# Patient Record
Sex: Male | Born: 1940 | Race: White | Hispanic: No | Marital: Married | Smoking: Never smoker
Health system: Southern US, Community
[De-identification: ages and names within clinical notes are randomized; demographics above are authoritative.]

## PROBLEM LIST (undated history)

## (undated) DIAGNOSIS — N289 Disorder of kidney and ureter, unspecified: Secondary | ICD-10-CM

## (undated) DIAGNOSIS — I10 Essential (primary) hypertension: Secondary | ICD-10-CM

---

## 2011-12-22 ENCOUNTER — Encounter (HOSPITAL_BASED_OUTPATIENT_CLINIC_OR_DEPARTMENT_OTHER): Payer: Self-pay | Admitting: *Deleted

## 2011-12-22 ENCOUNTER — Emergency Department (HOSPITAL_BASED_OUTPATIENT_CLINIC_OR_DEPARTMENT_OTHER)
Admission: EM | Admit: 2011-12-22 | Discharge: 2011-12-22 | Disposition: A | Discharge status: Home / Self Care | Payer: Medicare Other | Attending: Emergency Medicine | Admitting: Emergency Medicine

## 2011-12-22 ENCOUNTER — Emergency Department (HOSPITAL_BASED_OUTPATIENT_CLINIC_OR_DEPARTMENT_OTHER): Payer: Medicare Other

## 2011-12-22 DIAGNOSIS — Z79899 Other long term (current) drug therapy: Secondary | ICD-10-CM | POA: Insufficient documentation

## 2011-12-22 DIAGNOSIS — N134 Hydroureter: Secondary | ICD-10-CM | POA: Insufficient documentation

## 2011-12-22 DIAGNOSIS — R109 Unspecified abdominal pain: Secondary | ICD-10-CM | POA: Insufficient documentation

## 2011-12-22 DIAGNOSIS — I10 Essential (primary) hypertension: Secondary | ICD-10-CM | POA: Insufficient documentation

## 2011-12-22 DIAGNOSIS — N200 Calculus of kidney: Secondary | ICD-10-CM | POA: Insufficient documentation

## 2011-12-22 HISTORY — DX: Disorder of kidney and ureter, unspecified: N28.9

## 2011-12-22 HISTORY — DX: Essential (primary) hypertension: I10

## 2011-12-22 LAB — URINALYSIS, ROUTINE W REFLEX MICROSCOPIC
Bilirubin Urine: NEGATIVE
Glucose, UA: 100 mg/dL — AB
Ketones, ur: 40 mg/dL — AB
Protein, ur: NEGATIVE mg/dL
Urobilinogen, UA: 0.2 mg/dL (ref 0.0–1.0)

## 2011-12-22 LAB — BASIC METABOLIC PANEL
BUN: 16 mg/dL (ref 6–23)
CO2: 24 mEq/L (ref 19–32)
Calcium: 9.3 mg/dL (ref 8.4–10.5)
GFR calc non Af Amer: 73 mL/min — ABNORMAL LOW (ref >90)
Glucose, Bld: 142 mg/dL — ABNORMAL HIGH (ref 70–99)
Sodium: 135 mEq/L (ref 135–145)

## 2011-12-22 LAB — URINE MICROSCOPIC-ADD ON

## 2011-12-22 MED ORDER — SODIUM CHLORIDE 0.9 % IV SOLN
Freq: Once | INTRAVENOUS | Status: AC
  Administered 2011-12-22: 1000 mL via INTRAVENOUS

## 2011-12-22 MED ORDER — FENTANYL CITRATE 0.05 MG/ML IJ SOLN
100.0000 ug | Freq: Once | INTRAMUSCULAR | Status: AC
  Administered 2011-12-22: 100 ug via INTRAVENOUS
  Filled 2011-12-22: qty 2

## 2011-12-22 MED ORDER — TAMSULOSIN HCL 0.4 MG PO CAPS: 0.4000 mg | ORAL_CAPSULE | Freq: Every day | ORAL | Status: AC

## 2011-12-22 MED ORDER — KETOROLAC TROMETHAMINE 30 MG/ML IJ SOLN
30.0000 mg | Freq: Once | INTRAMUSCULAR | Status: AC
  Administered 2011-12-22: 30 mg via INTRAVENOUS
  Filled 2011-12-22: qty 1

## 2011-12-22 MED ORDER — ONDANSETRON HCL 4 MG/2ML IJ SOLN
4.0000 mg | Freq: Once | INTRAMUSCULAR | Status: AC
Start: 2011-12-22 — End: 2011-12-22
  Administered 2011-12-22: 4 mg via INTRAVENOUS
  Filled 2011-12-22: qty 2

## 2011-12-22 MED ORDER — OXYCODONE-ACETAMINOPHEN 10-325 MG PO TABS: 1.0000 | ORAL_TABLET | ORAL | Status: AC | PRN

## 2011-12-22 MED ORDER — ONDANSETRON 8 MG PO TBDP: 8.0000 mg | ORAL_TABLET | Freq: Three times a day (TID) | ORAL | Status: AC | PRN

## 2011-12-22 NOTE — ED Provider Notes (Signed)
History   This chart was scribed for Nelia Shi, MD by Shari Heritage. The patient was seen in room MH09/MH09. Patient's care was started at 1919.     CSN: [id number]  Arrival date & time 12/22/11  1919   First MD Initiated Contact with Patient 12/22/11 1932      Chief Complaint  Patient presents with  . Flank Pain    (Consider location/radiation/quality/duration/timing/severity/associated sxs/prior treatment) Patient is a 71 y.o. male presenting with flank pain. The history is provided by the patient. No language interpreter was used.  Flank Pain This is a new problem. The current episode started 6 to 12 hours ago. The problem occurs constantly. The problem has not changed since onset.Associated symptoms include abdominal pain. Pertinent negatives include no chest pain, no headaches and no shortness of breath. Nothing relieves the symptoms.   Edward Bell is a 70 y.o. male who presents to the Emergency Department complaining of constant, moderate to severe, right flank pain onset 7.5 hours ago. Associated symptoms include pallor, nausea and vomiting. Patient has a history of kidney stones and states that current pain is similar to that. Patient did not mention any relieving factors. Patient also has medical h/o renal disorder and HTN. Patient has never smoked. Patient was seen at 7:33PM.  Past Medical History  Diagnosis Date  . Renal disorder   . Hypertension     History reviewed. No pertinent past surgical history.  No family history on file.  History  Substance Use Topics  . Smoking status: Never Smoker   . Smokeless tobacco: Not on file  . Alcohol Use: No      Review of Systems  HENT: Negative for ear pain.   Respiratory: Negative for shortness of breath.   Cardiovascular: Negative for chest pain.  Gastrointestinal: Positive for nausea, vomiting and abdominal pain.  Genitourinary: Positive for flank pain.  Skin: Positive for pallor.  Neurological: Negative for  headaches.  All other systems reviewed and are negative.    Allergies  Review of patient's allergies indicates no known allergies.  Home Medications   Current Outpatient Rx  Name Route Sig Dispense Refill  . VITAMIN D PO Oral Take 1 tablet by mouth daily.    Marland Kitchen FINASTERIDE 1 MG PO TABS Oral Take 1 mg by mouth daily.    Marland Kitchen LORAZEPAM 0.5 MG PO TABS Oral Take 0.5 mg by mouth 2 (two) times daily as needed. For anxiety    . LOVASTATIN 40 MG PO TABS Oral Take 80 mg by mouth at bedtime.    . ADULT MULTIVITAMIN W/MINERALS CH Oral Take 1 tablet by mouth daily.    . OXYCODONE-ACETAMINOPHEN 5-325 MG PO TABS Oral Take 1 tablet by mouth every 4 (four) hours as needed. For pain    . ONDANSETRON 8 MG PO TBDP Oral Take 1 tablet (8 mg total) by mouth every 8 (eight) hours as needed for nausea. 20 tablet 0  . OXYCODONE-ACETAMINOPHEN 10-325 MG PO TABS Oral Take 1 tablet by mouth every 4 (four) hours as needed for pain. 30 tablet 0  . TAMSULOSIN HCL 0.4 MG PO CAPS Oral Take 1 capsule (0.4 mg total) by mouth daily. 15 capsule 0    BP 155/84  Pulse 64  Resp 20  SpO2 100%  Physical Exam  Nursing note and vitals reviewed. Constitutional: He is oriented to person, place, and time. He appears well-developed. No distress.  HENT:  Head: Normocephalic and atraumatic.  Eyes: Pupils are equal, round, and reactive  to light.  Neck: Normal range of motion. Thyromegaly: .meds.  Cardiovascular: Normal rate and intact distal pulses.   Pulmonary/Chest: No respiratory distress.  Abdominal: Normal appearance. He exhibits no distension. There is no tenderness. There is no rebound.  Musculoskeletal: Normal range of motion.  Neurological: He is alert and oriented to person, place, and time. No cranial nerve deficit.  Skin: Skin is warm and dry. No rash noted.  Psychiatric: He has a normal mood and affect. His behavior is normal.    ED Course  Procedures (including critical care time)  Scheduled Meds:    .  sodium chloride   Intravenous Once  . sodium chloride   Intravenous Once  . fentaNYL  100 mcg Intravenous Once  . fentaNYL  100 mcg Intravenous Once  . ketorolac  30 mg Intravenous Once  . ondansetron  4 mg Intravenous Once   Continuous Infusions:  PRN Meds:.  DIAGNOSTIC STUDIES: Oxygen Saturation is 100% on room air, normal by my interpretation.  Results for orders placed during the hospital encounter of 12/22/11  URINALYSIS, ROUTINE W REFLEX MICROSCOPIC      Component Value Range   Color, Urine YELLOW  YELLOW   APPearance CLEAR  CLEAR   Specific Gravity, Urine 1.018  1.005 - 1.030   pH 6.0  5.0 - 8.0   Glucose, UA 100 (*) NEGATIVE mg/dL   Hgb urine dipstick LARGE (*) NEGATIVE   Bilirubin Urine NEGATIVE  NEGATIVE   Ketones, ur 40 (*) NEGATIVE mg/dL   Protein, ur NEGATIVE  NEGATIVE mg/dL   Urobilinogen, UA 0.2  0.0 - 1.0 mg/dL   Nitrite NEGATIVE  NEGATIVE   Leukocytes, UA NEGATIVE  NEGATIVE  BASIC METABOLIC PANEL      Component Value Range   Sodium 135  135 - 145 mEq/L   Potassium 3.8  3.5 - 5.1 mEq/L   Chloride 95 (*) 96 - 112 mEq/L   CO2 24  19 - 32 mEq/L   Glucose, Bld 142 (*) 70 - 99 mg/dL   BUN 16  6 - 23 mg/dL   Creatinine, Ser 1.91  0.50 - 1.35 mg/dL   Calcium 9.3  8.4 - 47.8 mg/dL   GFR calc non Af Amer 73 (*) >90 mL/min   GFR calc Af Amer 84 (*) >90 mL/min  URINE MICROSCOPIC-ADD ON      Component Value Range   Squamous Epithelial / LPF RARE  RARE   RBC / HPF 11-20  <3 RBC/hpf   Bacteria, UA RARE  RARE   Ct Abdomen Pelvis Wo Contrast  12/22/2011  *RADIOLOGY REPORT*  Clinical Data: Right flank pain  CT ABDOMEN AND PELVIS WITHOUT CONTRAST  Technique:  Multidetector CT imaging of the abdomen and pelvis was performed following the standard protocol without intravenous contrast.  Comparison: None.  Findings: Mild obstruction of the right kidney with mild hydronephrosis and hydroureter.  2 mm calculus in the distal right ureter at the UVJ causing obstruction.  No  other kidney stones.  No obstruction on the left.  Multiple renal cysts.  The largest cyst is in the right upper pole measuring 5.5 x 6.8 cm.  Lung bases are clear.  Liver pancreas and spleen are normal. Gallbladder is normal.  Negative for bowel obstruction.  Sigmoid diverticulosis.  IMPRESSION: Obstruction of the right kidney by 2 mm stone in the distal right ureter.  Original Report Authenticated By: Camelia Phenes, M.D.     1. Kidney stone  MDM  I personally performed the services described in this documentation, which was scribed in my presence. The recorded information has been reviewed and considered.    After treatment in the ED the patient feels back to baseline and wants to go home.    Nelia Shi, MD 12/22/11 [phone]

## 2011-12-22 NOTE — ED Notes (Signed)
Right flank pain sudden onset. Pale, vomiting, and restless. States he has a history of kidney stones.

## 2013-06-02 ENCOUNTER — Encounter (HOSPITAL_BASED_OUTPATIENT_CLINIC_OR_DEPARTMENT_OTHER): Payer: Self-pay | Admitting: Emergency Medicine

## 2013-06-02 ENCOUNTER — Emergency Department (HOSPITAL_BASED_OUTPATIENT_CLINIC_OR_DEPARTMENT_OTHER)
Admission: EM | Admit: 2013-06-02 | Discharge: 2013-06-02 | Disposition: A | Discharge status: Home / Self Care | Payer: Medicare Other | Attending: Emergency Medicine | Admitting: Emergency Medicine

## 2013-06-02 ENCOUNTER — Emergency Department (HOSPITAL_BASED_OUTPATIENT_CLINIC_OR_DEPARTMENT_OTHER): Payer: Medicare Other

## 2013-06-02 DIAGNOSIS — S86012A Strain of left Achilles tendon, initial encounter: Secondary | ICD-10-CM

## 2013-06-02 DIAGNOSIS — Z79899 Other long term (current) drug therapy: Secondary | ICD-10-CM | POA: Insufficient documentation

## 2013-06-02 DIAGNOSIS — S96819A Strain of other specified muscles and tendons at ankle and foot level, unspecified foot, initial encounter: Principal | ICD-10-CM

## 2013-06-02 DIAGNOSIS — Y9389 Activity, other specified: Secondary | ICD-10-CM | POA: Insufficient documentation

## 2013-06-02 DIAGNOSIS — S93499A Sprain of other ligament of unspecified ankle, initial encounter: Secondary | ICD-10-CM | POA: Insufficient documentation

## 2013-06-02 DIAGNOSIS — Y929 Unspecified place or not applicable: Secondary | ICD-10-CM | POA: Insufficient documentation

## 2013-06-02 DIAGNOSIS — I10 Essential (primary) hypertension: Secondary | ICD-10-CM | POA: Insufficient documentation

## 2013-06-02 DIAGNOSIS — X500XXA Overexertion from strenuous movement or load, initial encounter: Secondary | ICD-10-CM | POA: Insufficient documentation

## 2013-06-02 DIAGNOSIS — Z87448 Personal history of other diseases of urinary system: Secondary | ICD-10-CM | POA: Insufficient documentation

## 2013-06-02 NOTE — ED Provider Notes (Signed)
CSN: [id number]     Arrival date & time 06/02/13  1214 History   First MD Initiated Contact with Patient 06/02/13 1414     Chief Complaint  Patient presents with  . Leg Injury   (Consider location/radiation/quality/duration/timing/severity/associated sxs/prior Treatment) HPI Comments: Patient is a 73 year old male with history of hypertension. He is taking down Christmas decorations when he stepped awkwardly and hyper extended his left ankle. He is complaining of discomfort in the region of the Achilles tendon extending up to the back of his calf. He is finding it difficult to walk and has been using crutches from home.  Patient is a 72 y.o. male presenting with leg pain. The history is provided by the patient.  Leg Pain Location:  Ankle Time since incident:  3 hours Ankle location:  L ankle Pain details:    Radiates to:  Does not radiate   Severity:  Moderate   Onset quality:  Sudden   Timing:  Constant   Progression:  Unchanged Chronicity:  New Dislocation: no     Past Medical History  Diagnosis Date  . Renal disorder   . Hypertension    History reviewed. No pertinent past surgical history. No family history on file. History  Substance Use Topics  . Smoking status: Never Smoker   . Smokeless tobacco: Not on file  . Alcohol Use: No    Review of Systems  All other systems reviewed and are negative.    Allergies  Review of patient's allergies indicates no known allergies.  Home Medications   Current Outpatient Rx  Name  Route  Sig  Dispense  Refill  . amLODipine (NORVASC) 5 MG tablet   Oral   Take 5 mg by mouth daily.         . Cholecalciferol (VITAMIN D PO)   Oral   Take 1 tablet by mouth daily.         . finasteride (PROPECIA) 1 MG tablet   Oral   Take 1 mg by mouth daily.         Marland Kitchen LORazepam (ATIVAN) 0.5 MG tablet   Oral   Take 0.5 mg by mouth 2 (two) times daily as needed. For anxiety         . lovastatin (MEVACOR) 40 MG tablet   Oral  Take 80 mg by mouth at bedtime.         . Multiple Vitamin (MULTIVITAMIN WITH MINERALS) TABS   Oral   Take 1 tablet by mouth daily.         Marland Kitchen oxyCODONE-acetaminophen (PERCOCET/ROXICET) 5-325 MG per tablet   Oral   Take 1 tablet by mouth every 4 (four) hours as needed. For pain         . Tamsulosin HCl (FLOMAX) 0.4 MG CAPS   Oral   Take 1 capsule (0.4 mg total) by mouth daily.   15 capsule   0    BP 151/83  Pulse 98  Temp(Src) 99.1 F (37.3 C) (Oral)  Resp 16  SpO2 100% Physical Exam  Nursing note and vitals reviewed. Constitutional: He is oriented to person, place, and time. He appears well-developed and well-nourished. No distress.  HENT:  Head: Normocephalic.  Neck: Normal range of motion. Neck supple.  Musculoskeletal:  The left posterior ankle is noted to have mild swelling. There is no ecchymosis or bruising. Dorsalis pedis pulses are palpable and sensation and motor are intact. He is able to plantar flex the foot and Thompson's test is negative.  Neurological: He is alert and oriented to person, place, and time.  Skin: Skin is warm. He is not diaphoretic.    ED Course  Procedures (including critical care time) Labs Review Labs Reviewed - No data to display Imaging Review Dg Tibia/fibula Left  06/02/2013   CLINICAL DATA:  Leg injury.  Fall.  EXAM: LEFT TIBIA AND FIBULA - 2 VIEW  COMPARISON:  None.  FINDINGS: There is no evidence of fracture or other focal bone lesions. Soft tissues are unremarkable.  IMPRESSION: No acute bony abnormality.   Electronically Signed   By: Britta MccreedySusan  Turner M.D.   On: 06/02/2013 12:47      MDM  No diagnosis found. This appears to be a sprain to the left Achilles tendon. This will be treated with a Cam Walker and when necessary followup with orthopedics if not improving before next week. His x-rays were negative for fracture.    Geoffery Lyonsouglas Mickell Birdwell, MD 06/02/13 [phone]

## 2013-06-02 NOTE — ED Notes (Signed)
Pt c/o left lower leg pain after hitting leg on bannister while taking down decorations. Pt using crutches in triage that he had at home.

## 2013-06-02 NOTE — Discharge Instructions (Signed)
Wear Cam Walker for next several days. Rest. Ice for 20 minutes every 2 hours while awake for the next 2 days.  If you're not improving significantly in the next several days, contact orthopedics to arrange a followup appointment.   Ligament Sprain Ligaments are tough, fibrous tissues that hold bones together at the joints. A sprain can occur when a ligament is stretched. This injury may take several weeks to heal. HOME CARE INSTRUCTIONS   Rest the injured area for as long as directed by your caregiver. Then slowly start using the joint as directed by your caregiver and as the pain allows.  Keep the affected joint raised if possible to lessen swelling.  Apply ice for 15-20 minutes to the injured area every couple hours for the first half day, then 03-04 times per day for the first 48 hours. Put the ice in a plastic bag and place a towel between the bag of ice and your skin.  Wear any splinting, casting, or elastic bandage applications as instructed.  Only take over-the-counter or prescription medicines for pain, discomfort, or fever as directed by your caregiver. Do not use aspirin immediately after the injury unless instructed by your caregiver. Aspirin can cause increased bleeding and bruising of the tissues.  If you were given crutches, continue to use them as instructed and do not resume weight bearing on the affected extremity until instructed. SEEK MEDICAL CARE IF:   Your bruising, swelling, or pain increases.  You have cold and numb fingers or toes if your arm or leg was injured. SEEK IMMEDIATE MEDICAL CARE IF:   Your toes are numb or blue if your leg was injured.  Your fingers are numb or blue if your arm was injured.  Your pain is not responding to medicines and continues to stay the same or gets worse. MAKE SURE YOU:   Understand these instructions.  Will watch your condition.  Will get help right away if you are not doing well or get worse. Document Released:  05/16/2000 Document Revised: 08/11/2011 Document Reviewed: 03/14/2008 [facility] Patient Information 2014 [place] House[place]Care, [place].

## 2015-10-08 IMAGING — CR DG TIBIA/FIBULA 2V*L*
4 series · 4 of 4 positions shown · non-contrast
Comparison: None.

CLINICAL DATA: Leg injury.  Fall.

EXAM:
LEFT TIBIA AND FIBULA - 2 VIEW

[t tib/fib ap left (1 of 2)]
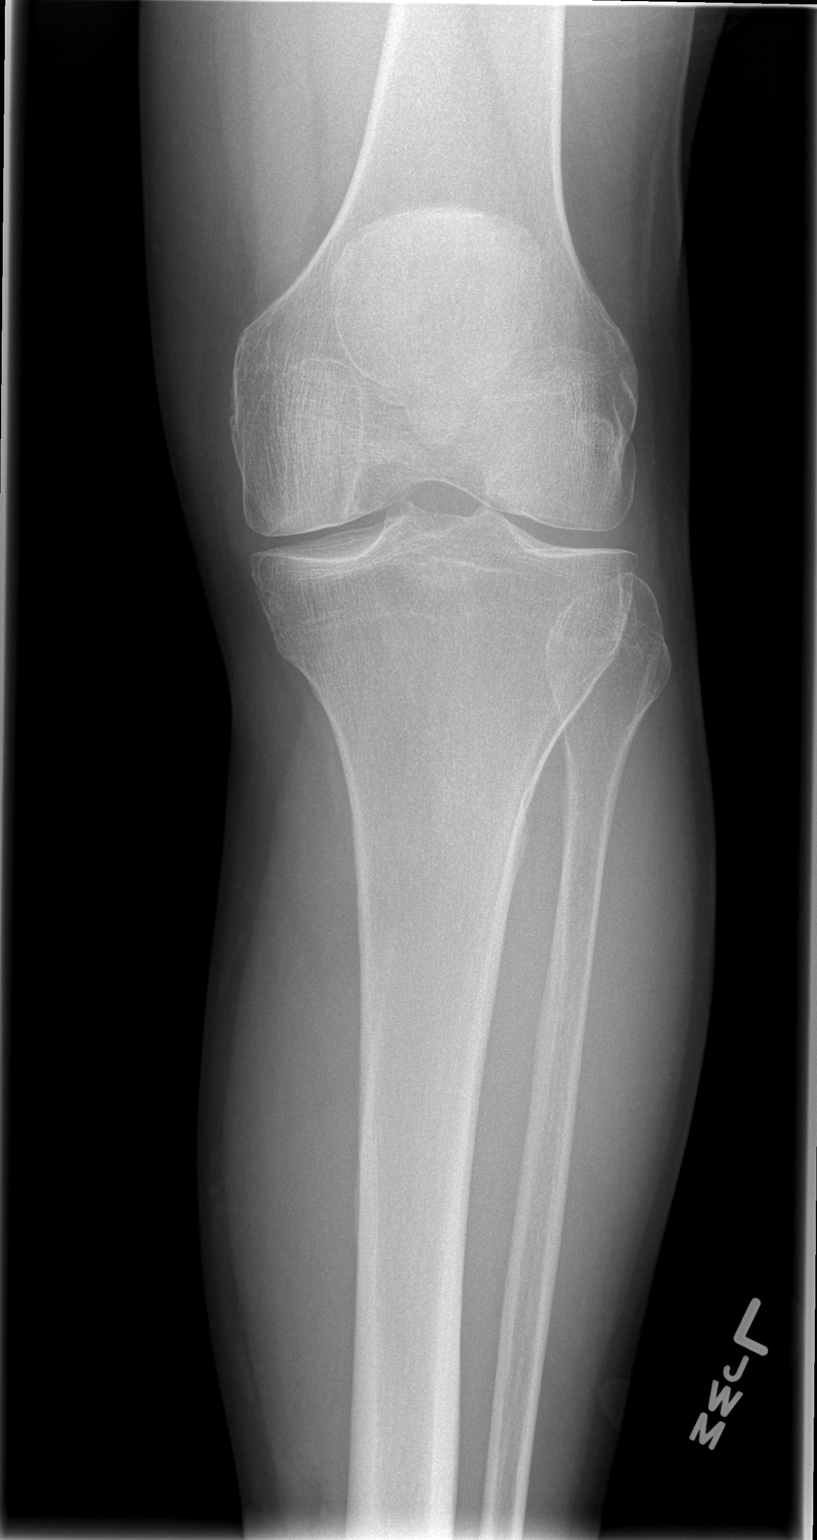

[t tib/fib ap left (2 of 2)]
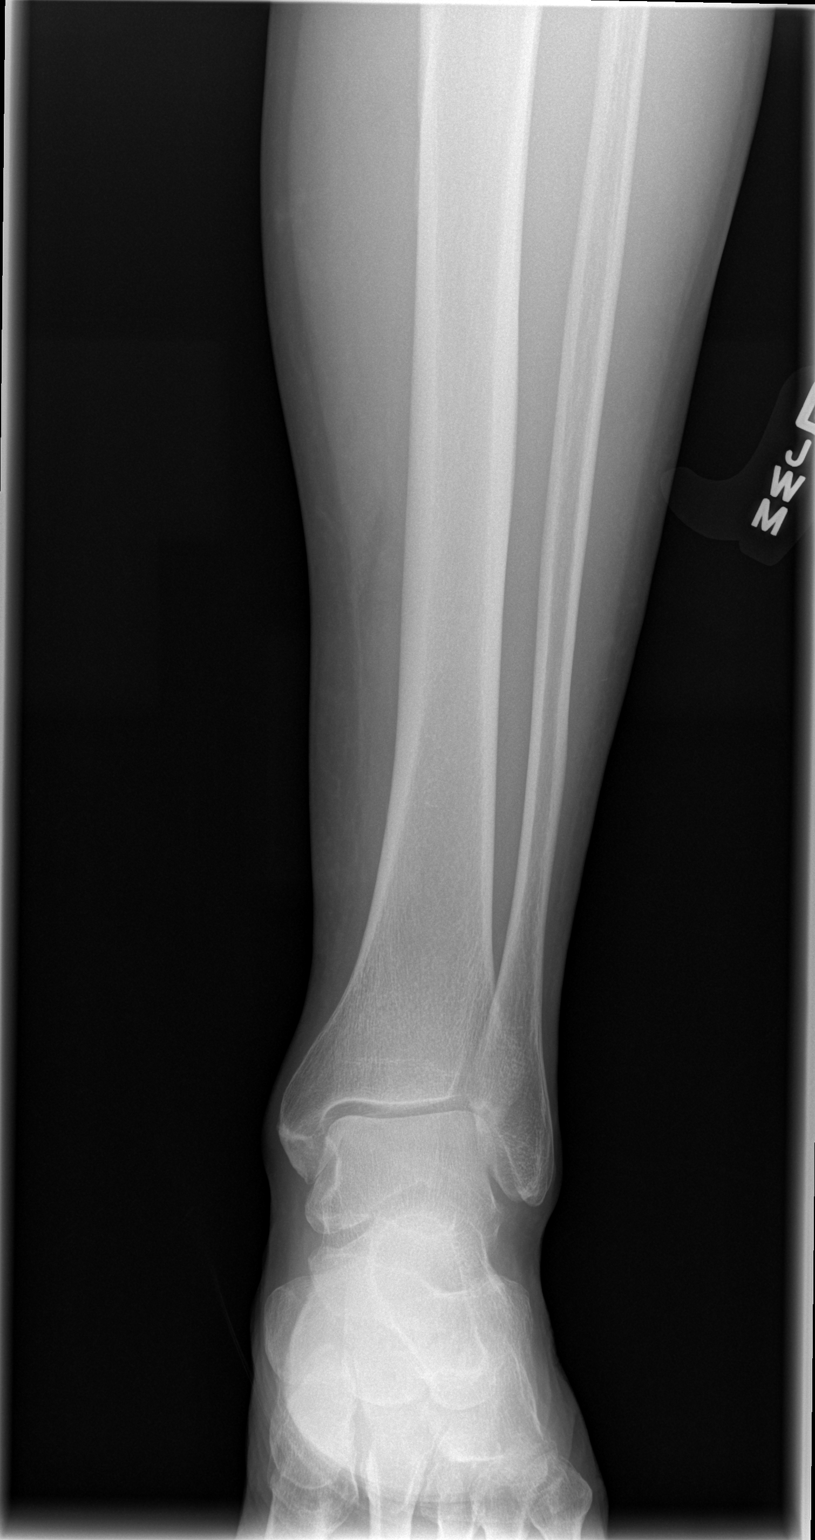

[t tib/fib lat left (1 of 2)]
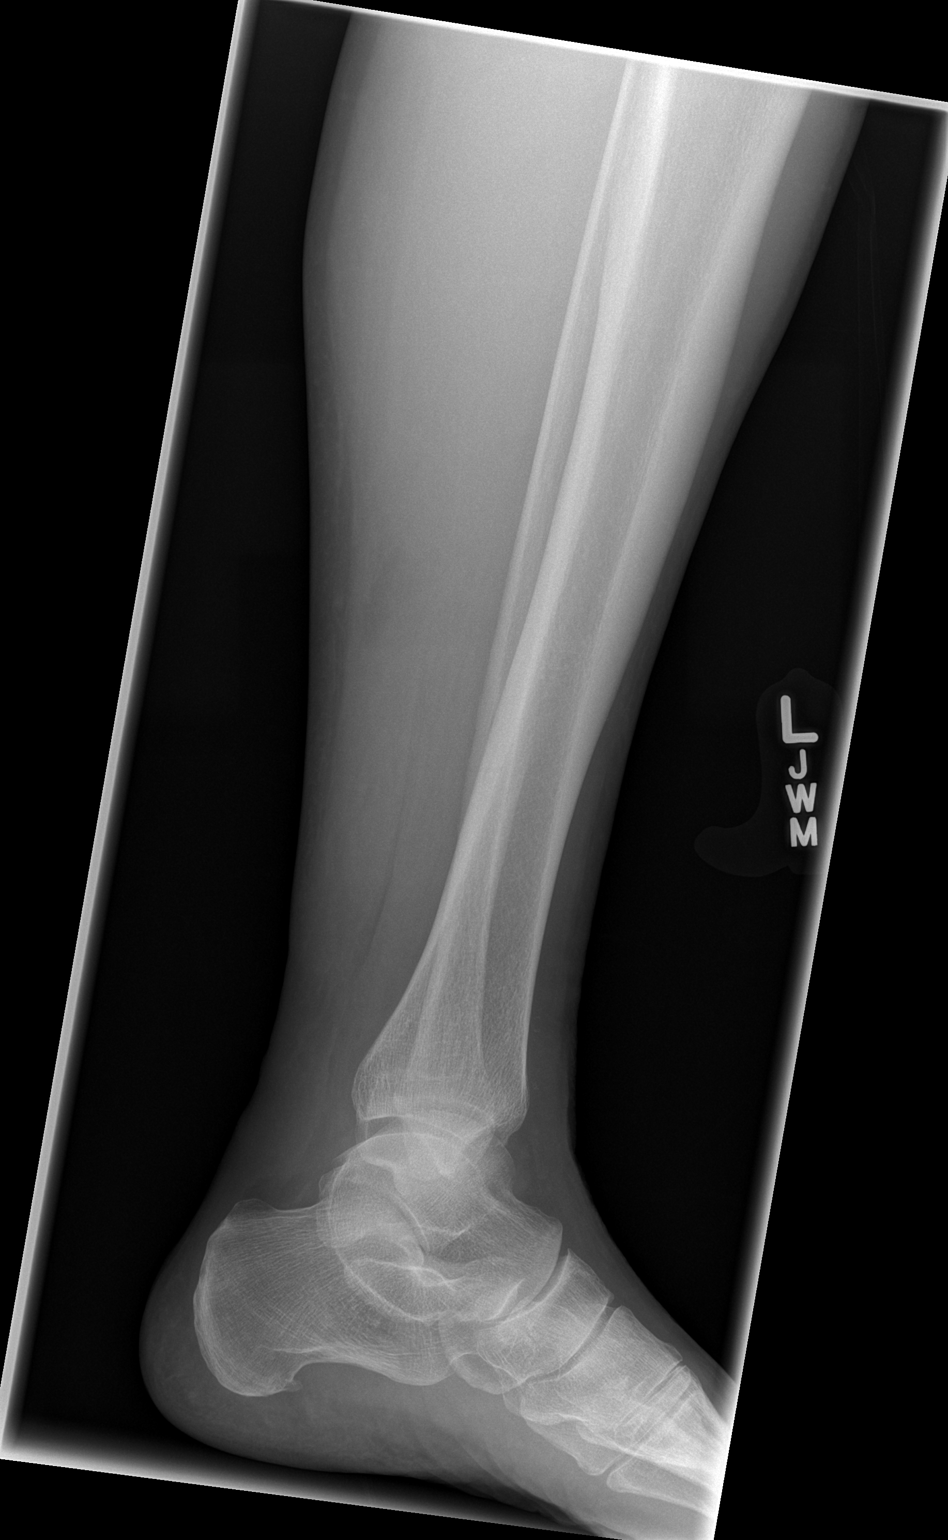

[t tib/fib lat left (2 of 2)]
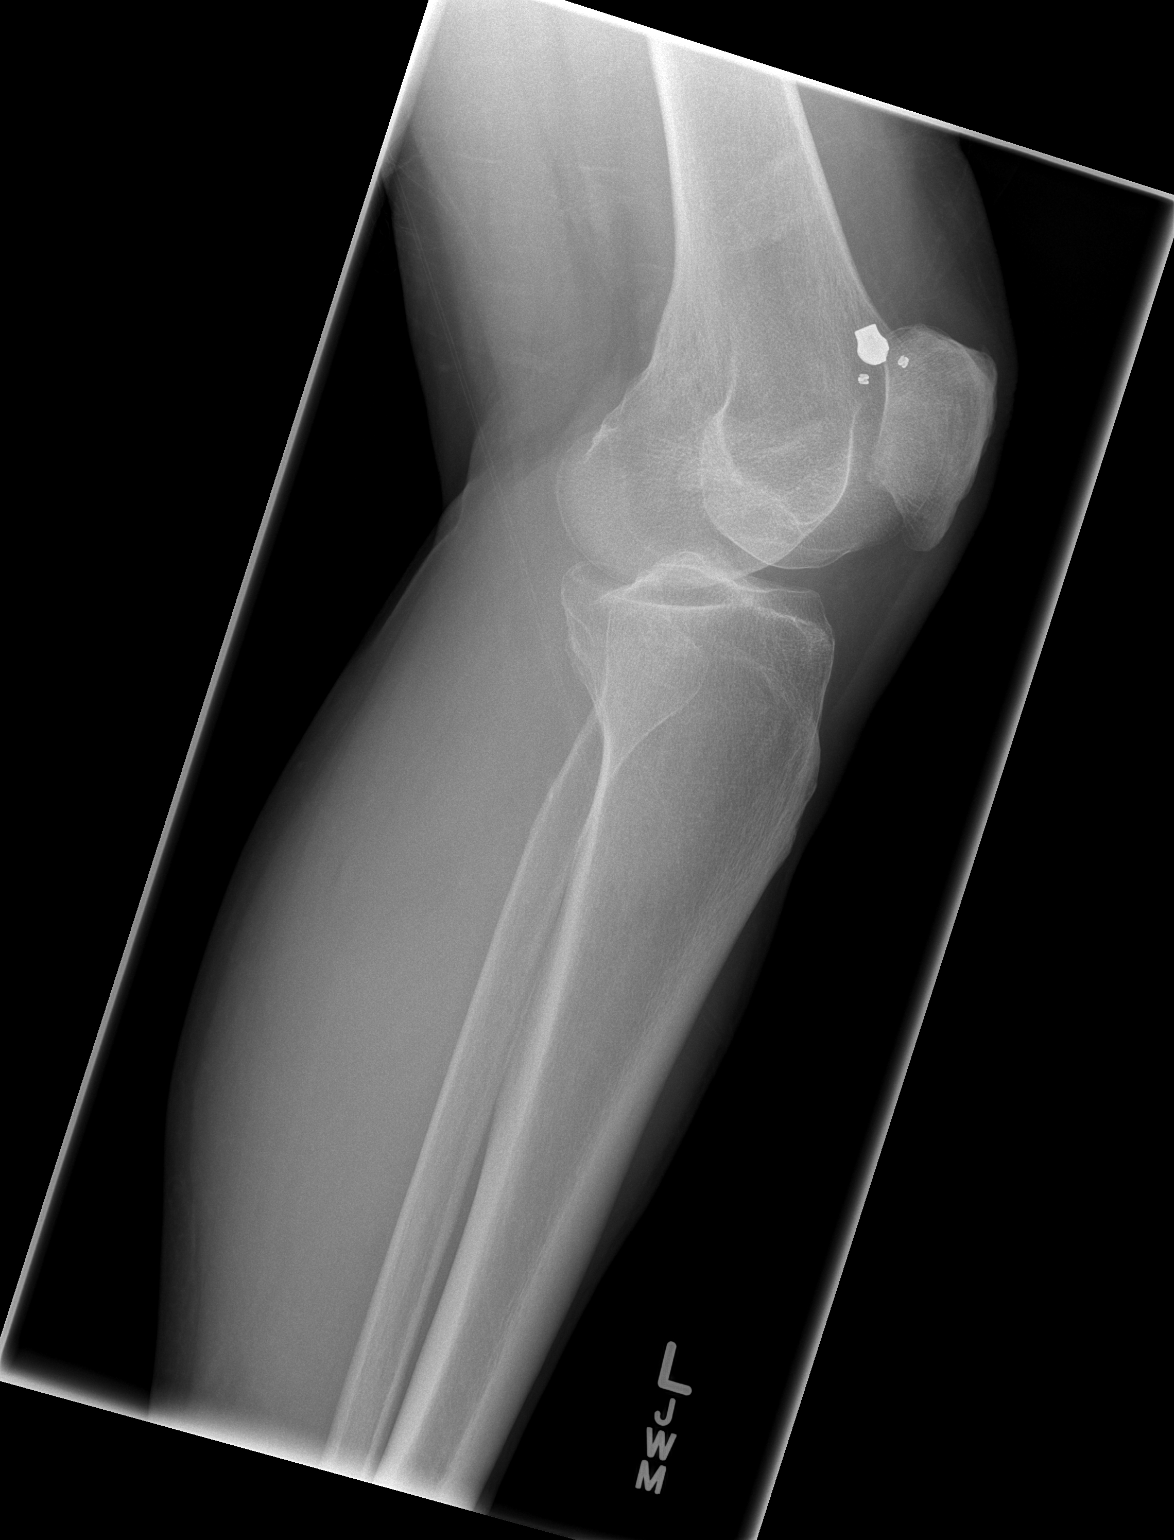

[4 of 4 positions shown; findings below may reference images not displayed]

FINDINGS: There is no evidence of fracture or other focal bone lesions. Soft
tissues are unremarkable.
IMPRESSION: No acute bony abnormality.
